# Patient Record
Sex: Male | Born: 1990 | Race: White | Hispanic: No | Marital: Single | Smoking: Never smoker
Health system: Southern US, Community
[De-identification: ages and names within clinical notes are randomized; demographics above are authoritative.]

---

## 2009-03-06 IMAGING — CT CT NECK W/O CM
1 series · 12 of 14 positions shown, 15 images · non-contrast
Comparison: NONE

CLINICAL DATA: Sore throat with swelling. 

CT OF THE NECK WITHOUT INTRAVENOUS CONTRAST
TECHNIQUE: A series of axial 5 mm thick slices at 3 mm increments 
were obtained.

[Series 2: wo · axial · 0.44mm/px · z∈[+15,+228]mm · 12 of 85 slices shown, 15 images]
[im 7/85  soft-tissue]
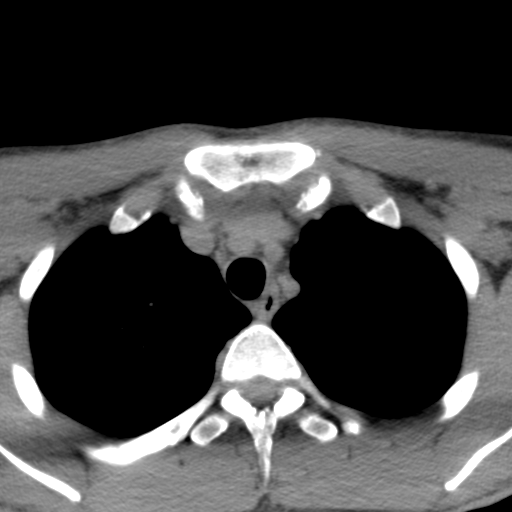
[im 7/85  bone]
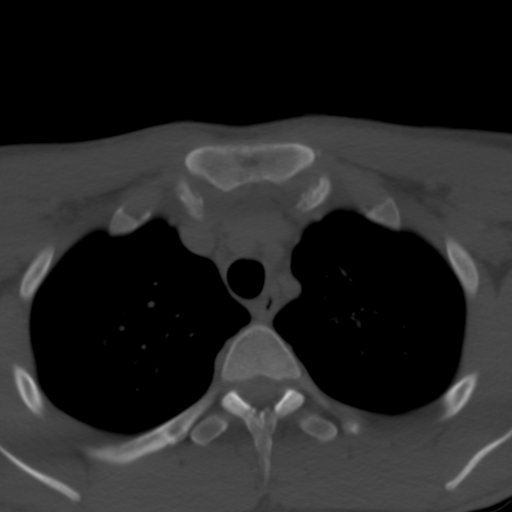
[im 13/85  bone]
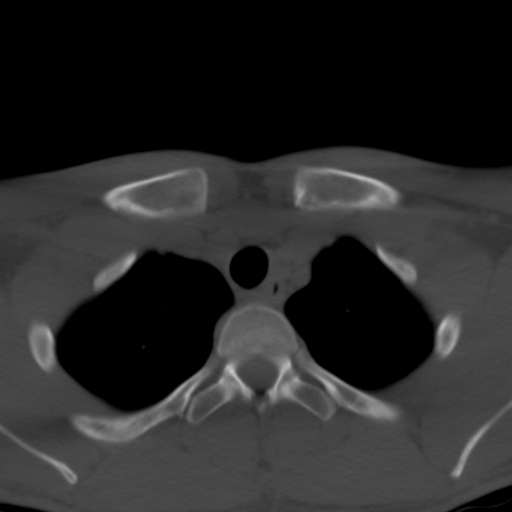
[im 20/85  bone]
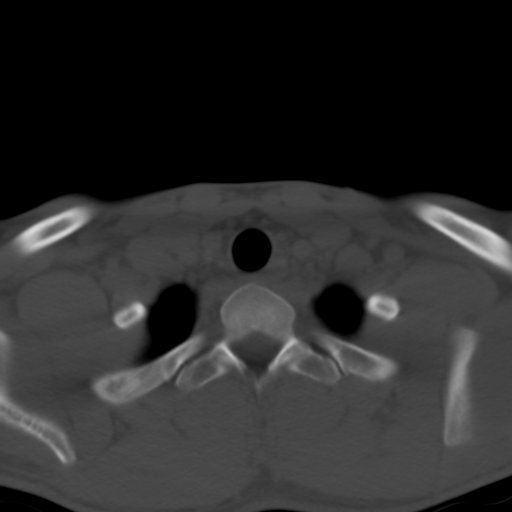
[im 26/85  bone]
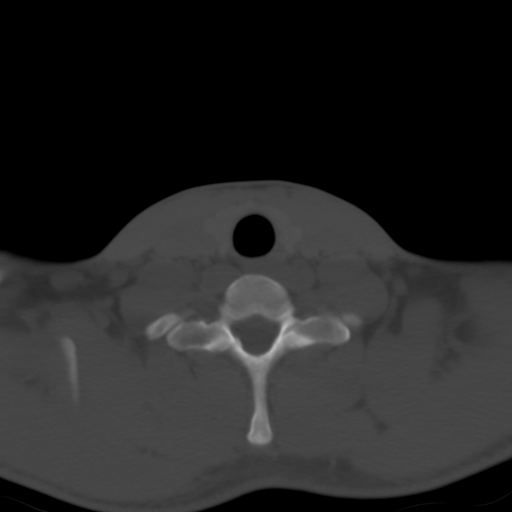
[im 33/85  soft-tissue]
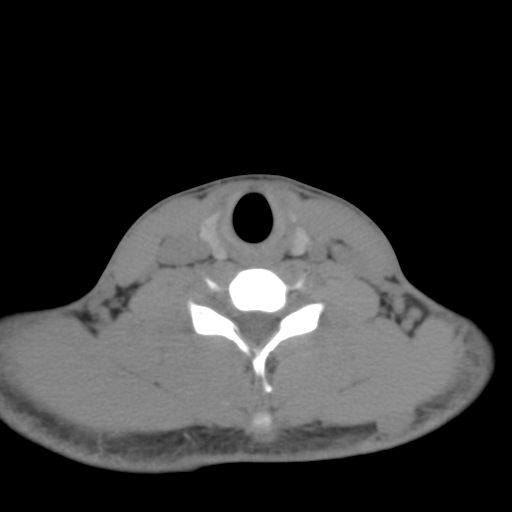
[im 33/85  bone]
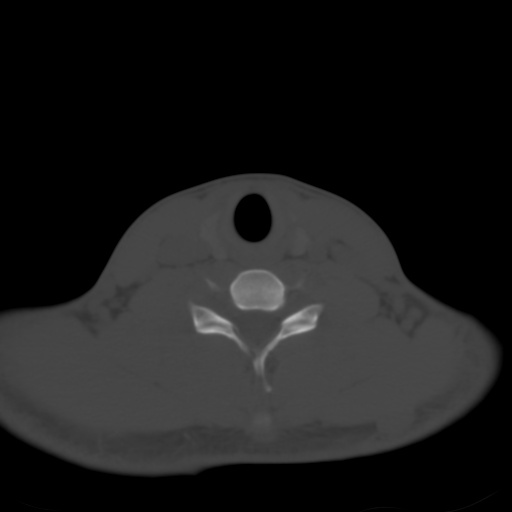
[im 39/85  bone]
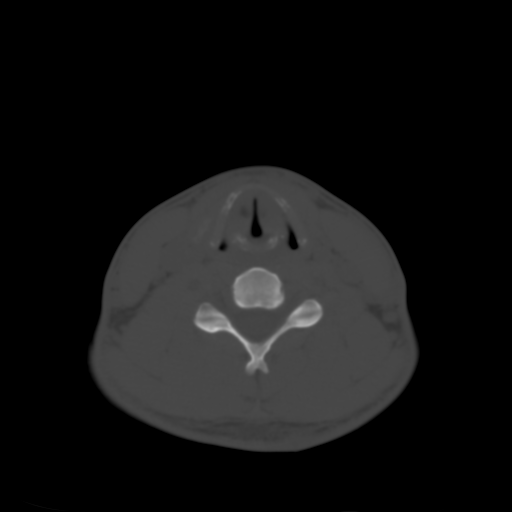
[im 46/85  bone]
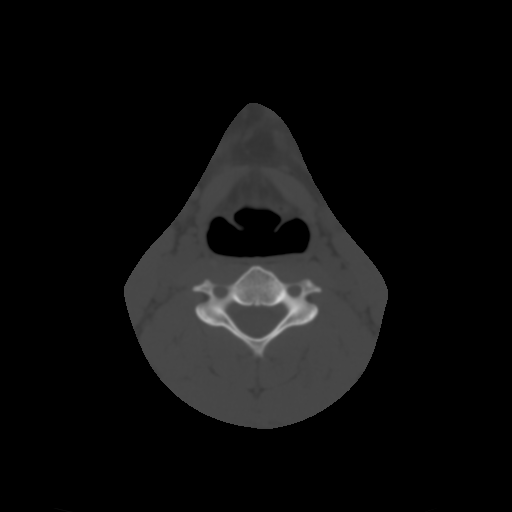
[im 52/85  bone]
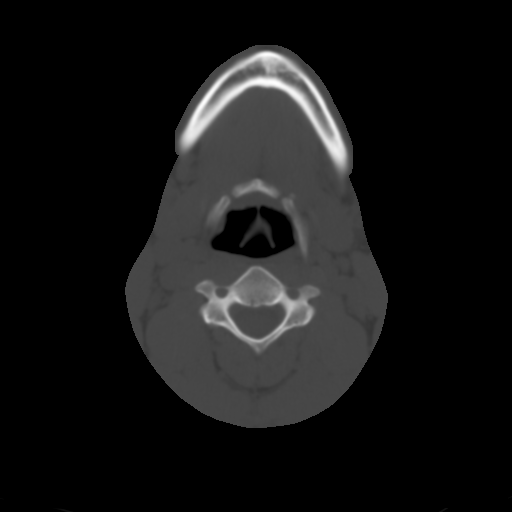
[im 59/85  soft-tissue]
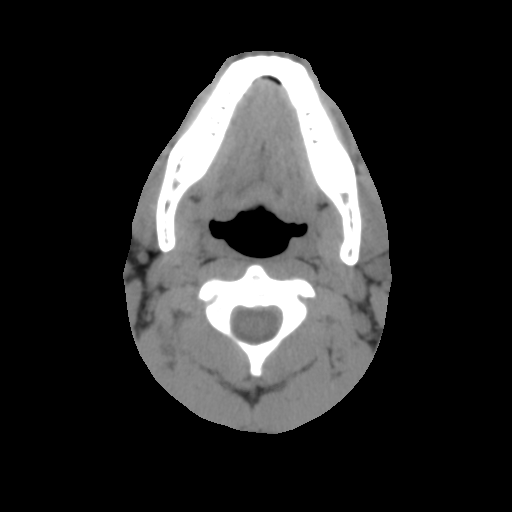
[im 59/85  bone]
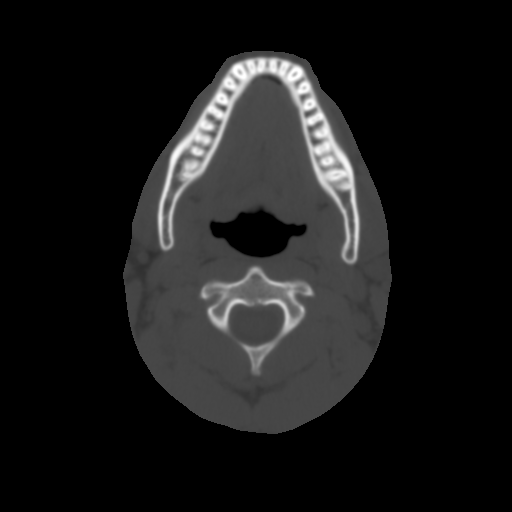
[im 65/85  bone]
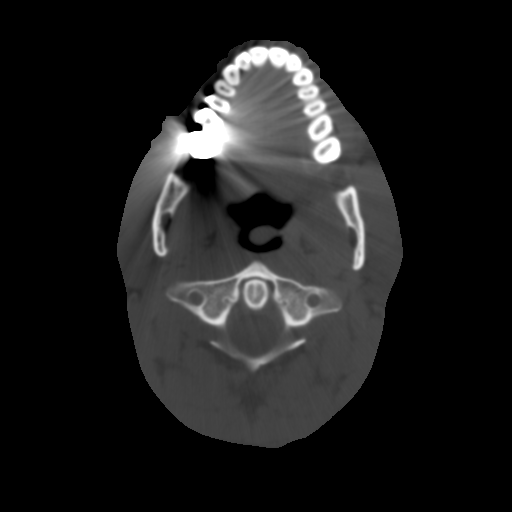
[im 72/85  bone]
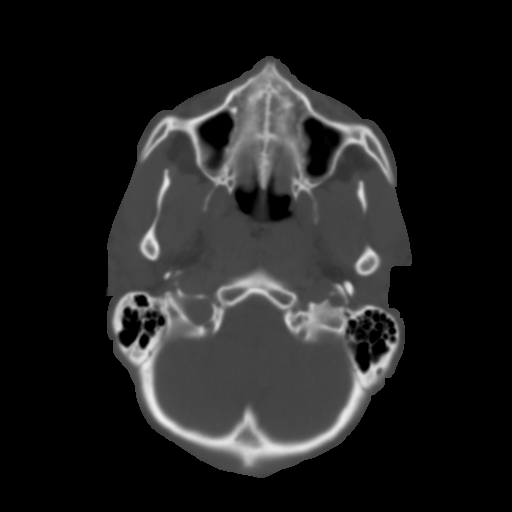
[im 78/85  bone]
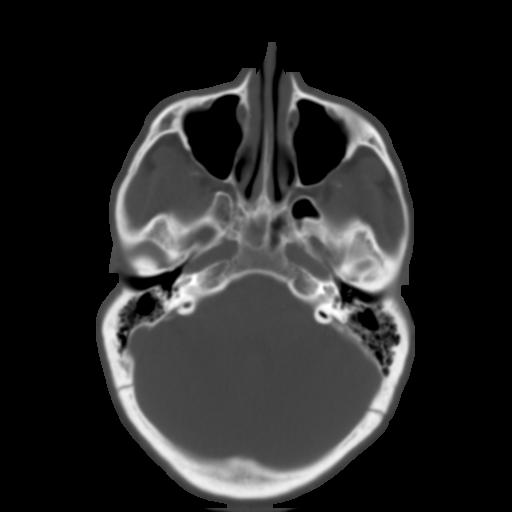

[12 of 14 positions shown; findings below may reference images not displayed]

FINDINGS: Scant mucosal thickening is noted in the maxillary, 
ethmoid, and sphenoid sinuses. No anterior superior mediastinal or 
apical lung masses.  There is minimal residual thymic tissue. No 
lytic or blastic lesions. No thyroid masses. No evidence of 
parotid or submandibular gland mass or calculus.  There is 
submental and anterior and posterior cervical reactive adenopathy 
without evidence of coalescent mass or adenopathy. No evidence of 
nasopharyngeal, oropharyngeal, or hypopharyngeal mass.  No 
supraglottic, glottic, or infraglottic masses.
IMPRESSION: Reactive adenopathy.  Scant mucosal 
thickening in the paranasal sinuses.  No evidence of coalescent 
12/15/2007 Dict Date: 12/15/2007  Tran Date:  12/15/2007 DAS  [REDACTED]

## 2014-07-27 ENCOUNTER — Ambulatory Visit (INDEPENDENT_AMBULATORY_CARE_PROVIDER_SITE_OTHER): Payer: Worker's Compensation | Admitting: Internal Medicine

## 2014-07-27 ENCOUNTER — Ambulatory Visit: Payer: Worker's Compensation

## 2014-07-27 VITALS — BP 118/68 | HR 81 | Temp 98.0°F | Resp 17 | Ht 71.0 in | Wt 179.0 lb

## 2014-07-27 DIAGNOSIS — S63502A Unspecified sprain of left wrist, initial encounter: Secondary | ICD-10-CM

## 2014-07-27 DIAGNOSIS — S61411A Laceration without foreign body of right hand, initial encounter: Secondary | ICD-10-CM | POA: Diagnosis not present

## 2014-07-27 DIAGNOSIS — Z23 Encounter for immunization: Secondary | ICD-10-CM | POA: Diagnosis not present

## 2014-07-27 NOTE — Patient Instructions (Signed)
Change dressings daily as long as wound is draining. Once not draining anymore, may leave wound open to the air. Do not get wet for 24 hours - after 24 hours may get wet with soap and water but do not scrub. If wound gets infected - skin around gets warm, red, or painful or if starts draining pus or if you develop fever or chills - return to be seen. Return in 7-10 days for suture removal.  

## 2014-07-27 NOTE — Progress Notes (Signed)
[facility] [address], [place] [id number] [phone]  Date:  07/27/2014   Name:  Wayne Finley   DOB:  [birth year]   MRN:  [id number]  PCP:  No primary care provider on file.    Chief Complaint: Laceration   History of Present Illness:  This is a 24 y.o. male  who is presenting with a laceration on his right palm that occurred at work today. Pt is a wine distributor. He grabbed a bottle of wine and the top broke in his hand. He cleaned it with soap and water. He states "it doesn't hurt that bad". He denies numbness or weakness. He thinks he got a tetanus booster 3 years ago but is not sure. [facility] revealed no tetanus booster in past 10 years.  Review of Systems:  Review of Systems  Constitutional: Negative for fever and chills.  Skin: Positive for wound.  Neurological: Negative for weakness and numbness.   There are no active problems to display for this patient.  Prior to Admission medications   Not on File   Not on File  History reviewed. No pertinent past surgical history.  History  Substance Use Topics  . Smoking status: Never Smoker   . Smokeless tobacco: Not on file  . Alcohol Use: Not on file    History reviewed. No pertinent family history.  Medication list has been reviewed and updated.  Physical Examination:  Physical Exam  Constitutional: He is oriented to person, place, and time. He appears well-developed and well-nourished. No distress.  HENT:  Head: Normocephalic and atraumatic.  Right Ear: Hearing normal.  Left Ear: Hearing normal.  Nose: Nose normal.  Eyes: Conjunctivae and lids are normal. Right eye exhibits no discharge. Left eye exhibits no discharge. No scleral icterus.  Cardiovascular: Normal rate, regular rhythm and normal pulses.   Pulmonary/Chest: Effort normal. No respiratory distress.  Musculoskeletal: Normal range of motion.  Neurological: He is alert and oriented to person, place, and time. He has normal  strength. No sensory deficit.  Skin: Skin is warm and dry.  2.5 cm laceration on right palmar surface. Superficial in nature. No tendon involvement.  Psychiatric: He has a normal mood and affect. His speech is normal and behavior is normal. Thought content normal.   BP 118/68 mmHg  Pulse 81  Temp(Src) 98 F (36.7 C) (Oral)  Resp 17  Ht 5\' 11"  (1.803 m)  Wt 179 lb (81.194 kg)  BMI 24.98 kg/m2  SpO2 99%  Procedure: Verbal consent obtained. Skin was anesthetized with 3 cc 1% lido with epi and cleaned with soap and water. A 2.5 cm laceration located on right palm was sutured with #4 horizontal and #1 simple 5.0 ethilon sutures. Wound was dressed and wound care discussed.  [facility] reading (PRIMARY) by  Dr. Perrin MalteseGuest: negative for foreign body  Assessment and Plan:  1. Laceration of hand, right, initial encounter Radiograph negative for glass foreign body. Wound repaired. Wound care discussed. Return in 7-10 days for suture removal. - DG Hand Complete Right; Future  2. Need for Tdap vaccination - Tdap vaccine greater than or equal to 7yo IM   Roswell MinersNicole V. Dyke BrackettBush, PA-C, MHS [facility]  07/27/2014

## 2014-08-05 ENCOUNTER — Ambulatory Visit (INDEPENDENT_AMBULATORY_CARE_PROVIDER_SITE_OTHER): Payer: Worker's Compensation | Admitting: Family Medicine

## 2014-08-05 VITALS — BP 120/80 | HR 56 | Temp 98.2°F | Resp 16 | Ht 71.0 in | Wt 176.0 lb

## 2014-08-05 DIAGNOSIS — Z4802 Encounter for removal of sutures: Secondary | ICD-10-CM

## 2014-08-05 NOTE — Progress Notes (Signed)
   Subjective:  This chart was scribed for Norberto Sorenson, MD by Stann Ore, Medical Scribe. This patient was seen in Room 6 and the patient's care was started 2:34 PM.    Patient ID: Wayne Finley, male    DOB: 07-07-1990, 24 y.o.   MRN: [id number] Chief Complaint  Patient presents with  . Suture / Staple Removal    right hand    HPI Wayne Finley is a 24 y.o. male who presents to [facility] for follow up for sutures removal. Wound healed well, no comlications healed.   Lanier Clam put in sutures from glass foreign body 9 days ago. Tdap was placed. His x-rays were normal.    History reviewed. No pertinent past medical history. No current outpatient prescriptions on file prior to visit.   No current facility-administered medications on file prior to visit.   No Known Allergies    Review of Systems  Constitutional: Negative for fever, chills and diaphoresis.  HENT: Negative for facial swelling.   Musculoskeletal: Positive for myalgias.  Skin: Positive for wound. Negative for color change, pallor and rash.  Neurological: Negative for facial asymmetry, weakness and numbness.       Objective:   Physical Exam  Constitutional: He is oriented to person, place, and time. He appears well-developed and well-nourished. No distress.  HENT:  Head: Normocephalic and atraumatic.  Eyes: No scleral icterus.  Pulmonary/Chest: Effort normal.  Neurological: He is alert and oriented to person, place, and time.  Skin: Skin is warm and dry. He is not diaphoretic.  Psychiatric: He has a normal mood and affect. His behavior is normal.    BP 120/80 mmHg  Pulse 56  Temp(Src) 98.2 F (36.8 C) (Oral)  Resp 16  Ht 5\' 11"  (1.803 m)  Wt 176 lb (79.833 kg)  BMI 24.56 kg/m2  SpO2 99%       Assessment & Plan:  Suture removal by cma, no comp wound well healed. RTC immed for any increased pain, redness, swelling, or purulent drainage.   I personally performed the services described in this  documentation, which was scribed in my presence. The recorded information has been reviewed and considered, and addended by me as needed.  Norberto Sorenson, MD MPH

## 2014-10-26 NOTE — Progress Notes (Signed)
I directly supervised Ms. Bush as she provided care to this patient. I have reviewed her note and agree with her documentation.

## 2015-10-20 IMAGING — CR DG HAND COMPLETE 3+V*R*
3 series · 3 of 3 positions shown · non-contrast
Comparison: None.

CLINICAL DATA: Hand cut with glass bottle this morning. Question
foreign body.

EXAM:
RIGHT HAND - COMPLETE 3+ VIEW

[PA]
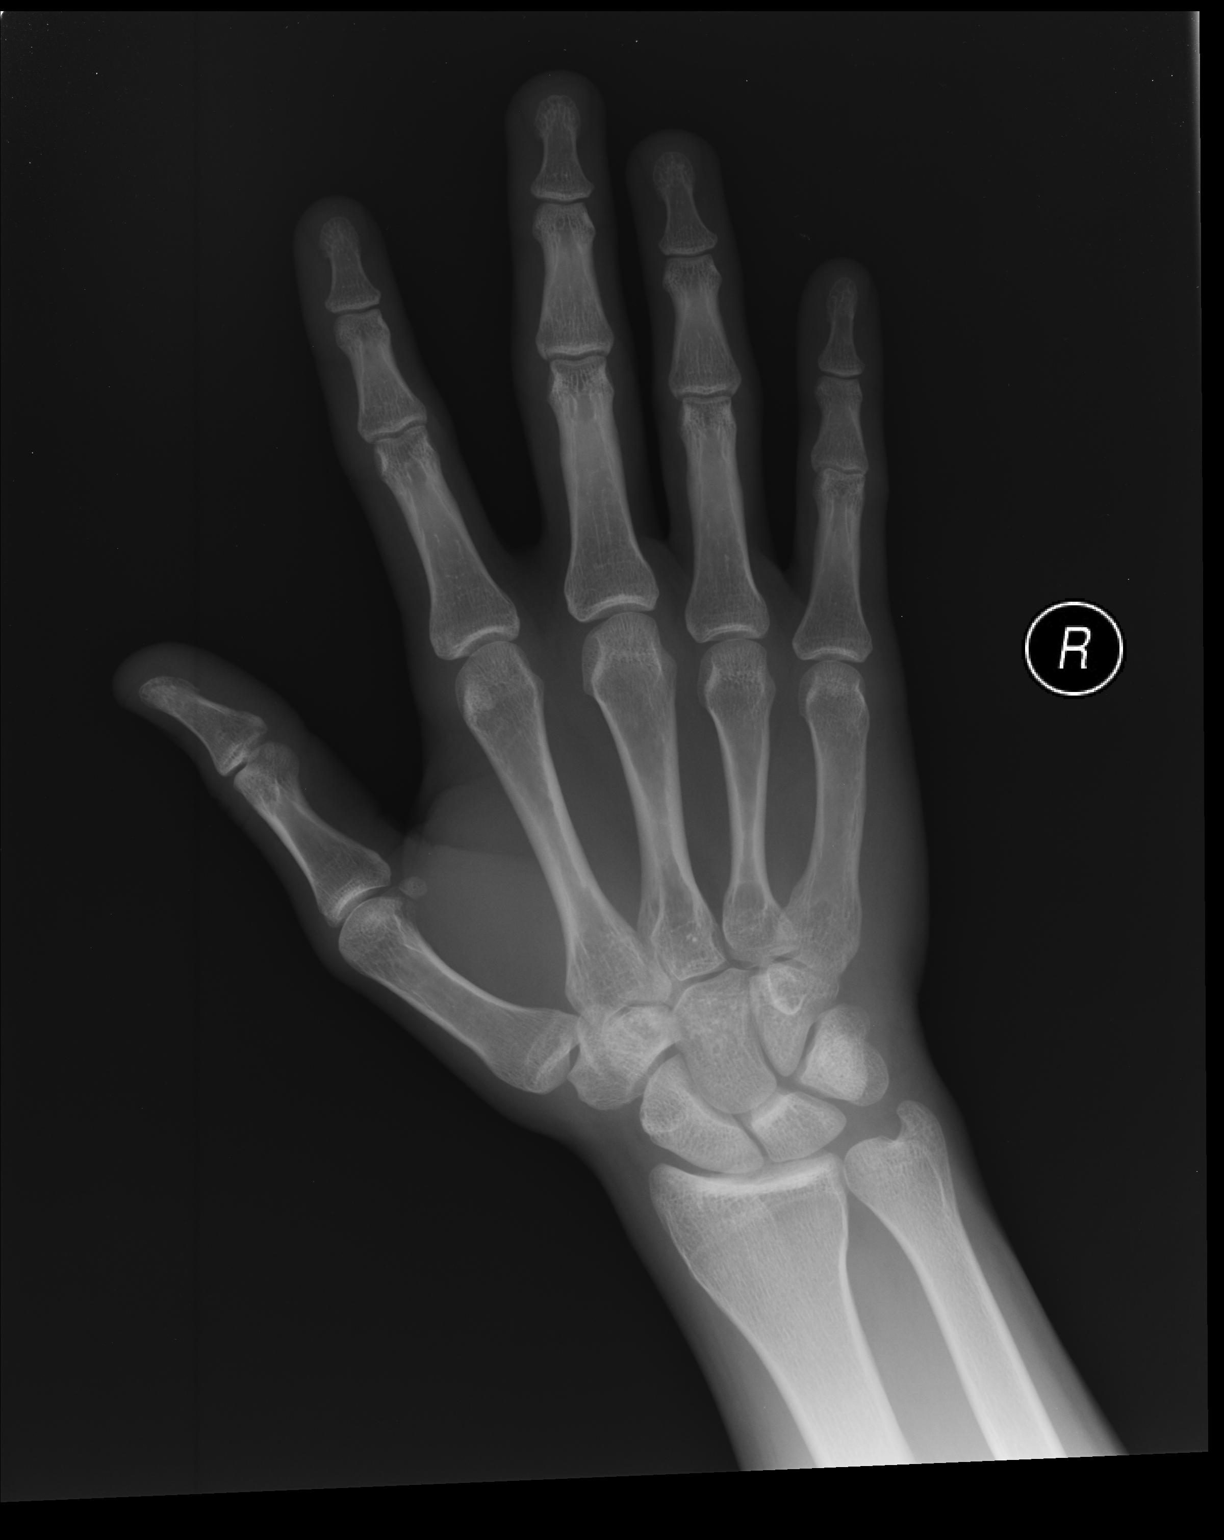

[pa obl]
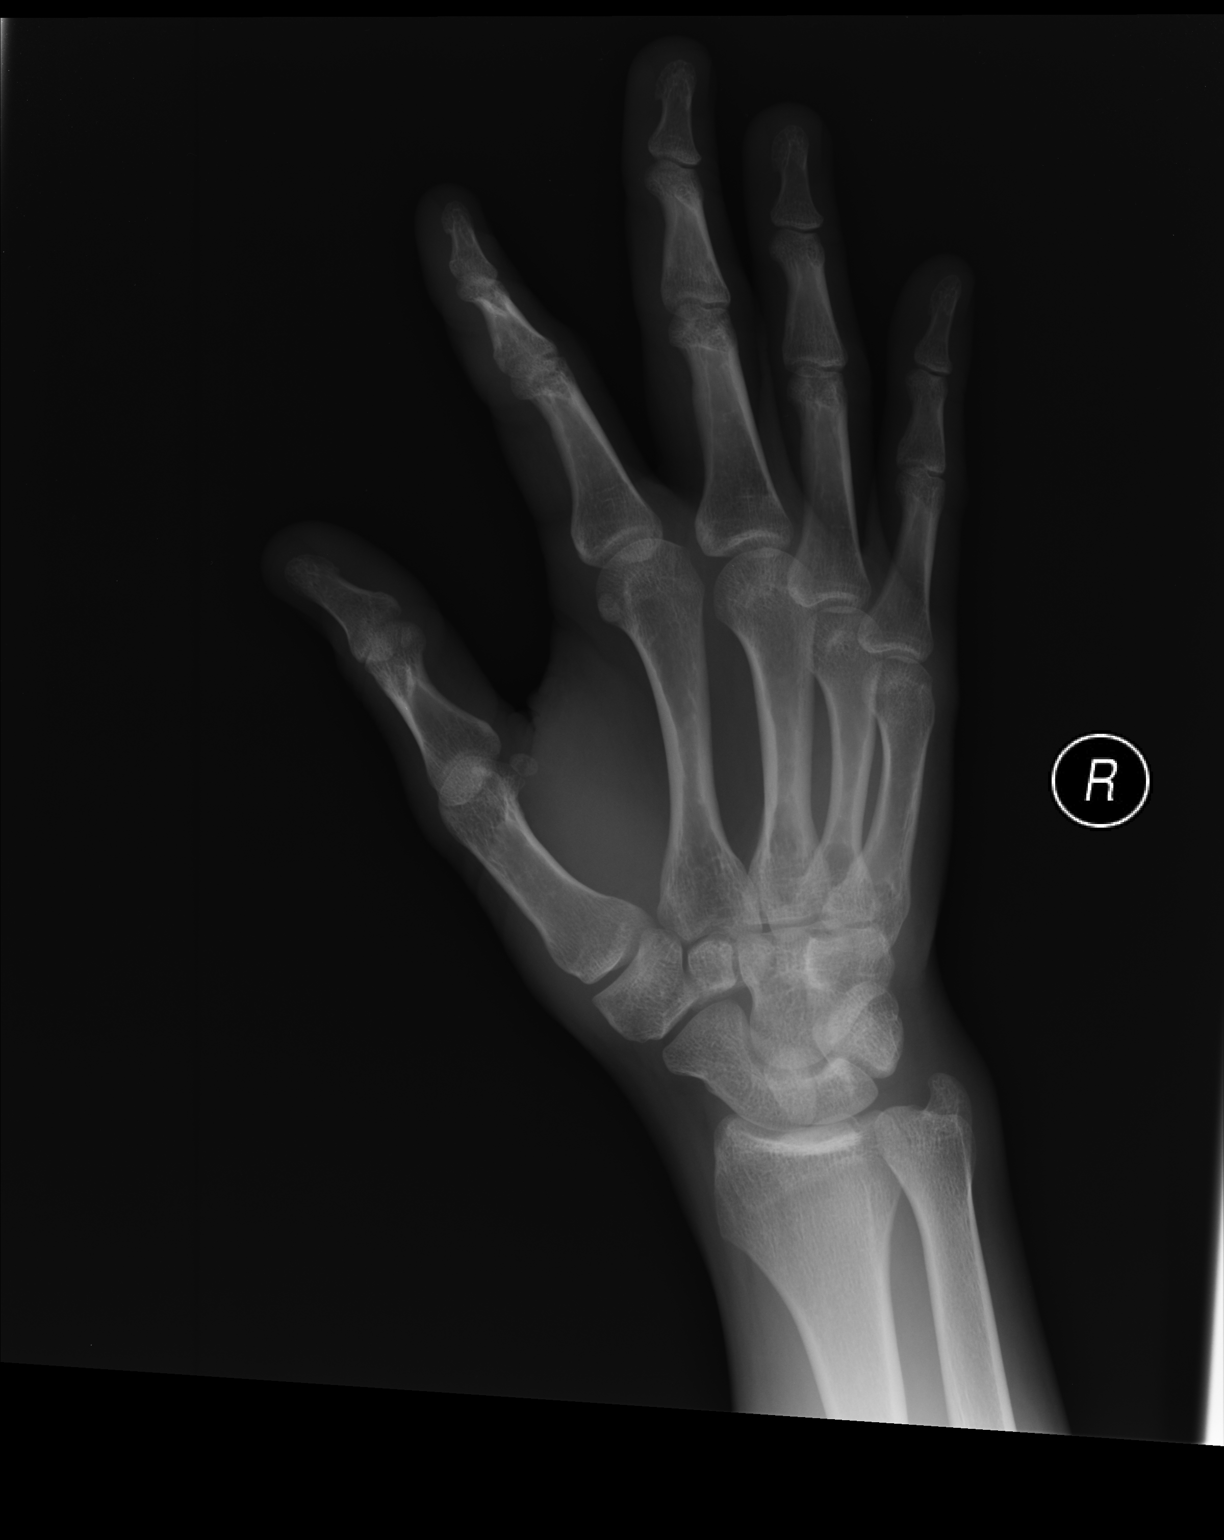

[lateral]
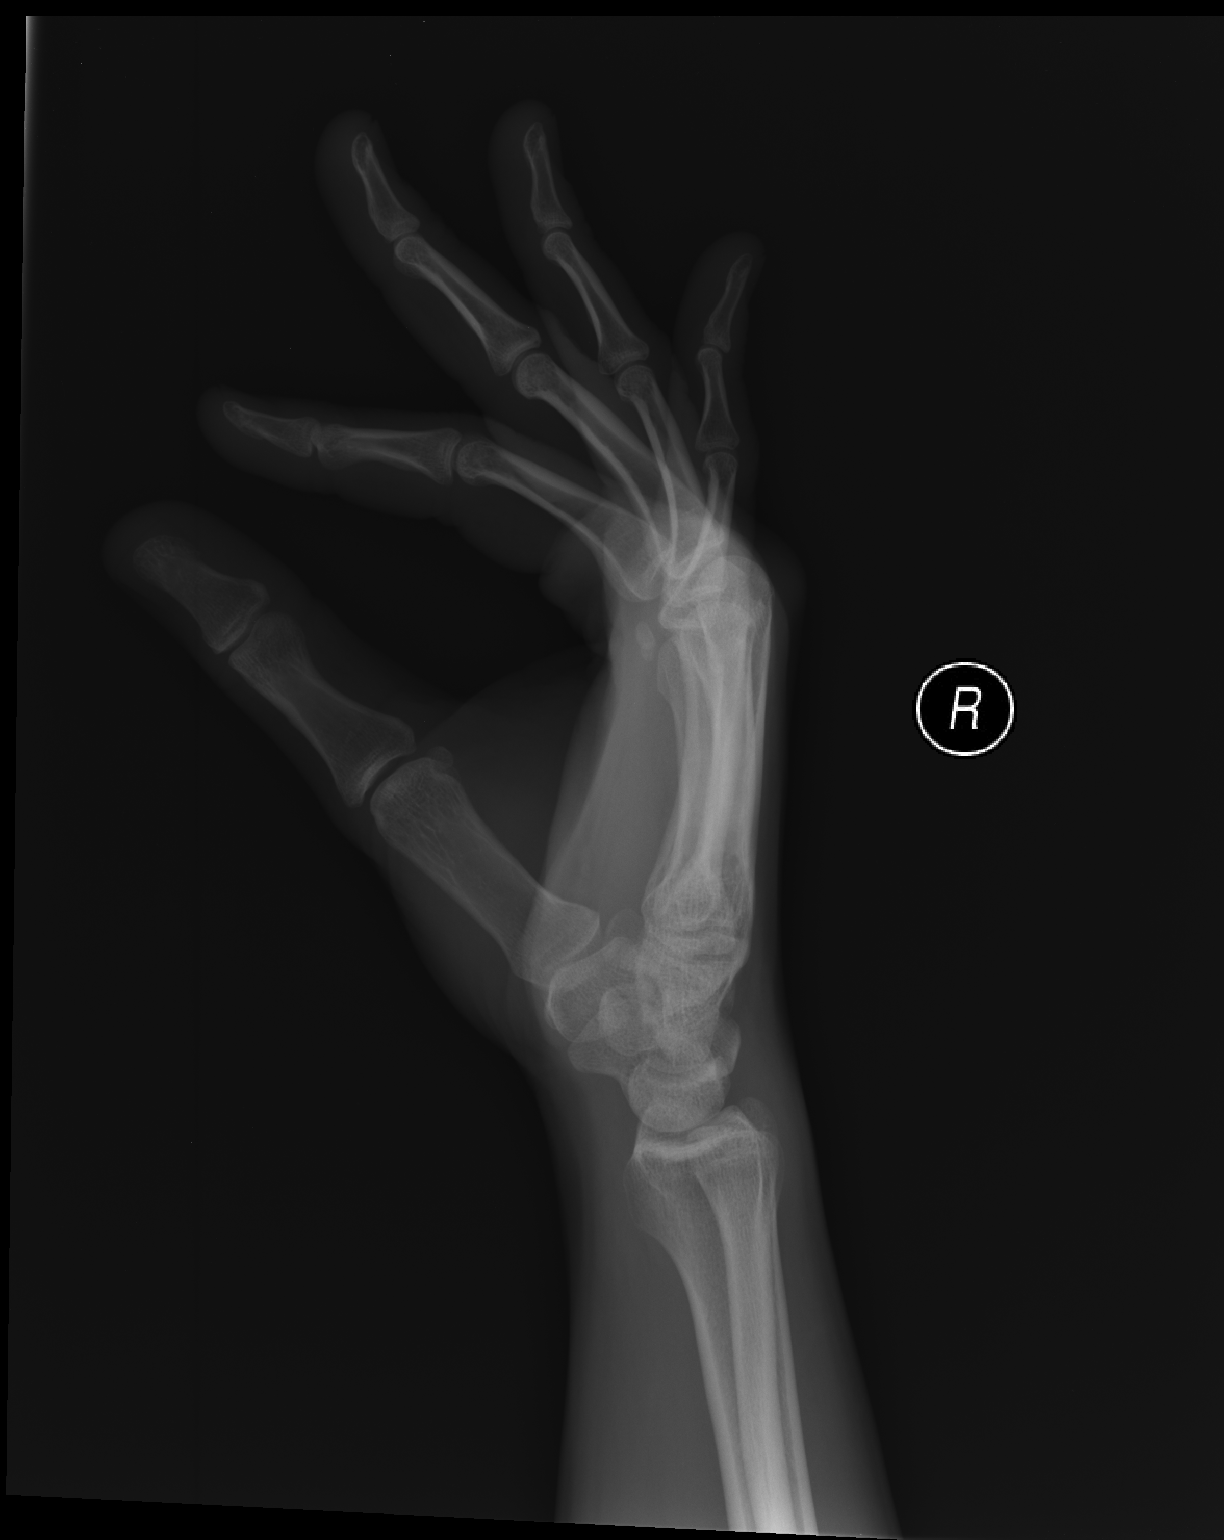

[3 of 3 positions shown; findings below may reference images not displayed]

FINDINGS: There is no evidence of fracture or dislocation. There is no
evidence of arthropathy or other focal bone abnormality. Soft
tissues are unremarkable. No radiopaque foreign body visualized.
IMPRESSION: Negative.
